# Patient Record
Sex: Male | Born: 1955 | Race: Black or African American | Hispanic: No | Marital: Single | Smoking: Never smoker
Health system: Southern US, Community
[De-identification: ages and names within clinical notes are randomized; demographics above are authoritative.]

---

## 2006-07-31 ENCOUNTER — Ambulatory Visit: Payer: Self-pay | Admitting: Cardiothoracic Surgery

## 2006-09-08 ENCOUNTER — Ambulatory Visit: Payer: Self-pay | Admitting: Cardiothoracic Surgery

## 2012-10-03 DIAGNOSIS — E7800 Pure hypercholesterolemia, unspecified: Secondary | ICD-10-CM | POA: Insufficient documentation

## 2012-10-03 DIAGNOSIS — Z7901 Long term (current) use of anticoagulants: Secondary | ICD-10-CM | POA: Insufficient documentation

## 2012-10-03 DIAGNOSIS — D126 Benign neoplasm of colon, unspecified: Secondary | ICD-10-CM | POA: Insufficient documentation

## 2015-05-29 DIAGNOSIS — J449 Chronic obstructive pulmonary disease, unspecified: Secondary | ICD-10-CM | POA: Insufficient documentation

## 2015-05-29 DIAGNOSIS — N179 Acute kidney failure, unspecified: Secondary | ICD-10-CM | POA: Insufficient documentation

## 2015-08-18 DIAGNOSIS — N529 Male erectile dysfunction, unspecified: Secondary | ICD-10-CM | POA: Insufficient documentation

## 2015-08-18 DIAGNOSIS — Z952 Presence of prosthetic heart valve: Secondary | ICD-10-CM | POA: Insufficient documentation

## 2015-08-18 DIAGNOSIS — I10 Essential (primary) hypertension: Secondary | ICD-10-CM | POA: Insufficient documentation

## 2015-12-27 DIAGNOSIS — I50810 Right heart failure, unspecified: Secondary | ICD-10-CM | POA: Insufficient documentation

## 2015-12-27 DIAGNOSIS — I2729 Other secondary pulmonary hypertension: Secondary | ICD-10-CM | POA: Insufficient documentation

## 2016-04-04 DIAGNOSIS — F17210 Nicotine dependence, cigarettes, uncomplicated: Secondary | ICD-10-CM | POA: Insufficient documentation

## 2016-04-04 DIAGNOSIS — H9041 Sensorineural hearing loss, unilateral, right ear, with unrestricted hearing on the contralateral side: Secondary | ICD-10-CM | POA: Insufficient documentation

## 2016-04-04 DIAGNOSIS — H6991 Unspecified Eustachian tube disorder, right ear: Secondary | ICD-10-CM | POA: Insufficient documentation

## 2016-04-04 DIAGNOSIS — H6981 Other specified disorders of Eustachian tube, right ear: Secondary | ICD-10-CM | POA: Insufficient documentation

## 2016-06-04 ENCOUNTER — Encounter: Payer: Self-pay | Admitting: Family Medicine

## 2016-06-04 ENCOUNTER — Ambulatory Visit (INDEPENDENT_AMBULATORY_CARE_PROVIDER_SITE_OTHER): Payer: Medicaid Other | Admitting: Family Medicine

## 2016-06-04 ENCOUNTER — Ambulatory Visit (HOSPITAL_BASED_OUTPATIENT_CLINIC_OR_DEPARTMENT_OTHER)
Admission: RE | Admit: 2016-06-04 | Discharge: 2016-06-04 | Disposition: A | Discharge status: Home / Self Care | Payer: Medicaid Other | Source: Ambulatory Visit | Attending: Family Medicine | Admitting: Family Medicine

## 2016-06-04 VITALS — BP 110/76 | HR 104 | Ht 74.0 in | Wt 212.0 lb

## 2016-06-04 DIAGNOSIS — M19021 Primary osteoarthritis, right elbow: Secondary | ICD-10-CM | POA: Diagnosis not present

## 2016-06-04 DIAGNOSIS — M25521 Pain in right elbow: Secondary | ICD-10-CM

## 2016-06-04 DIAGNOSIS — M25562 Pain in left knee: Secondary | ICD-10-CM

## 2016-06-04 DIAGNOSIS — M25522 Pain in left elbow: Secondary | ICD-10-CM | POA: Insufficient documentation

## 2016-06-04 DIAGNOSIS — M25421 Effusion, right elbow: Secondary | ICD-10-CM | POA: Insufficient documentation

## 2016-06-04 DIAGNOSIS — G8929 Other chronic pain: Secondary | ICD-10-CM

## 2016-06-04 DIAGNOSIS — M25561 Pain in right knee: Secondary | ICD-10-CM

## 2016-06-04 DIAGNOSIS — M19022 Primary osteoarthritis, left elbow: Secondary | ICD-10-CM | POA: Diagnosis not present

## 2016-06-04 MED ORDER — METHYLPREDNISOLONE ACETATE 40 MG/ML IJ SUSP
40.0000 mg | Freq: Once | INTRAMUSCULAR | Status: AC
  Administered 2016-06-04: 40 mg via INTRA_ARTICULAR

## 2016-06-04 NOTE — Patient Instructions (Addendum)
You have arthritis in your elbows and knees. These are the different medications you can take for this: Tylenol 500mg  1-2 tabs three times a day for pain. Minimize use of aleve, ibuprofen due to risks to heart, kidneys.  Some supplements that may help for arthritis: Boswellia extract, curcumin, pycnogenol. Capsaicin, aspercreme, or biofreeze topically up to four times a day may also help with pain. Cortisone injections are an option - you were given these for the elbows today.. If cortisone injections do not help, there are different types of shots that may help but they take longer to take effect (gel shots for knees). It's important that you continue to stay active. Straight leg raises, knee extensions 3 sets of 10 once a day (add ankle weight if these become too easy). Consider physical therapy to strengthen muscles around the joint that hurts to take pressure off of the joint itself. Shoe inserts with good arch support may be helpful. Heat or ice 15 minutes at a time 3-4 times a day as needed to help with pain. Water aerobics and cycling with low resistance are the best two types of exercise for arthritis. I will touch base with the orthopedists about possibilities for your elbows.

## 2016-06-06 DIAGNOSIS — M25521 Pain in right elbow: Secondary | ICD-10-CM | POA: Insufficient documentation

## 2016-06-06 DIAGNOSIS — M25561 Pain in right knee: Secondary | ICD-10-CM | POA: Insufficient documentation

## 2016-06-06 DIAGNOSIS — M25522 Pain in left elbow: Secondary | ICD-10-CM

## 2016-06-06 DIAGNOSIS — M25562 Pain in left knee: Secondary | ICD-10-CM

## 2016-06-06 NOTE — Assessment & Plan Note (Signed)
with effusions.  2/2 DJD as well.  Tylenol, topical medications, supplements that may help discussed.  He opted to wait on injections and/or aspirations for now.  Home exercises reviewed.

## 2016-06-06 NOTE — Progress Notes (Addendum)
PCP: Rogers Blocker, MD  Subjective:   HPI: Patient is a 61 y.o. male here for bilateral elbow, knee pain.  Patient reports biggest concern is pain in both elbows. Has had over 4 year history of pain in both diffusely anterior to posterior. Pain level 0/10 at rest but can get up to 10/10 and sharp at times. Associated swelling. Tried wrapping these. Is left handed. Not taking any medicines for this besides tylenol (on warfarin). Also has bilateral knee pain with swelling - left knee worse than right (7/10 vs 5/10 at worst). No recent injuries. No catching or locking. Worse with ambulation. No skin changes, numbness.  No past medical history on file.  No current outpatient prescriptions on file prior to visit.   No current facility-administered medications on file prior to visit.     No past surgical history on file.  No Known Allergies  Social History   Social History  . Marital status: Single    Spouse name: N/A  . Number of children: N/A  . Years of education: N/A   Occupational History  . Not on file.   Social History Main Topics  . Smoking status: Never Smoker  . Smokeless tobacco: Never Used  . Alcohol use Not on file  . Drug use: Unknown  . Sexual activity: Not on file   Other Topics Concern  . Not on file   Social History Narrative  . No narrative on file    No family history on file.  BP 110/76   Pulse (!) 104   Ht 6\' 2"  (1.88 m)   Wt 212 lb (96.2 kg)   BMI 27.22 kg/m   Review of Systems: See HPI above.     Objective:  Physical Exam:  Gen: NAD, comfortable in exam room  Bilateral elbows: Mild swelling diffusely.  No bruising, other deformity. No focal tenderness throughout elbows. Lacks 5 degrees flexion and 37 degrees extension on both elbows. Collateral ligaments intact. Strength 5/5 with flexion and extension. Sensation intact to light touch. NVI distally.  Bilateral knees: Mod effusion.  No other gross deformity,  ecchymoses. No TTP currently. ROM 0 - 120 degrees. Negative ant/post drawers. Negative valgus/varus testing. Negative lachmanns. Negative mcmurrays, apleys, patellar apprehension. NV intact distally.   Assessment & Plan:  1. Bilateral elbow pain - independently reviewed radiographs - patient with severe arthropathy of both elbows.  We discussed tylenol, topical medications, supplements that may help.  Cortisone injections given today.  Will review radiographs with ortho as well.  After informed written consent, patient was seated on exam table. Right elbow was prepped with alcohol swab and utilizing ultrasound guidance to identify joint right elbow was injected with 2:1 bupivicaine: depomedrol. Patient tolerated the procedure well without immediate complications.  After informed written consent, patient was seated on exam table. Left elbow was prepped with alcohol swab and utilizing ultrasound guidance to identify joint left elbow was injected with 2:1 bupivicaine: depomedrol. Patient tolerated the procedure well without immediate complications.  2. Bilateral knee pain - with effusions.  2/2 DJD as well.  Tylenol, topical medications, supplements that may help discussed.  He opted to wait on injections and/or aspirations for now.  Home exercises reviewed.   Addendum:  Discussed severe elbow arthropathy with ortho - discussed arthroscopy and OK procedures if patient wanted to consider operative intervention which would help with elbow motion.  Attempted to contact patient by phone but number is disconnected - letter sent.

## 2016-06-06 NOTE — Assessment & Plan Note (Signed)
independently reviewed radiographs - patient with severe arthropathy of both elbows.  We discussed tylenol, topical medications, supplements that may help.  Cortisone injections given today.  Will review radiographs with ortho as well.  After informed written consent, patient was seated on exam table. Right elbow was prepped with alcohol swab and utilizing ultrasound guidance to identify joint right elbow was injected with 2:1 bupivicaine: depomedrol. Patient tolerated the procedure well without immediate complications.  After informed written consent, patient was seated on exam table. Left elbow was prepped with alcohol swab and utilizing ultrasound guidance to identify joint left elbow was injected with 2:1 bupivicaine: depomedrol. Patient tolerated the procedure well without immediate complications.

## 2016-11-20 ENCOUNTER — Ambulatory Visit: Payer: Self-pay | Admitting: Family Medicine

## 2016-11-21 ENCOUNTER — Ambulatory Visit: Payer: Self-pay | Admitting: Family Medicine

## 2016-12-11 ENCOUNTER — Ambulatory Visit (INDEPENDENT_AMBULATORY_CARE_PROVIDER_SITE_OTHER): Payer: Medicaid Other | Admitting: Family Medicine

## 2016-12-11 ENCOUNTER — Encounter: Payer: Self-pay | Admitting: Family Medicine

## 2016-12-11 VITALS — BP 104/75 | HR 110 | Ht 74.0 in | Wt 230.0 lb

## 2016-12-11 DIAGNOSIS — M79642 Pain in left hand: Secondary | ICD-10-CM | POA: Diagnosis not present

## 2016-12-11 DIAGNOSIS — M79641 Pain in right hand: Secondary | ICD-10-CM | POA: Diagnosis not present

## 2016-12-11 DIAGNOSIS — M79645 Pain in left finger(s): Secondary | ICD-10-CM | POA: Diagnosis not present

## 2016-12-11 DIAGNOSIS — M25531 Pain in right wrist: Secondary | ICD-10-CM

## 2016-12-11 MED ORDER — METHYLPREDNISOLONE ACETATE 40 MG/ML IJ SUSP
40.0000 mg | Freq: Once | INTRAMUSCULAR | Status: AC
  Administered 2016-12-11: 40 mg via INTRA_ARTICULAR

## 2016-12-11 MED ORDER — METHYLPREDNISOLONE ACETATE 40 MG/ML IJ SUSP
20.0000 mg | Freq: Once | INTRAMUSCULAR | Status: AC
  Administered 2016-12-11: 20 mg via INTRA_ARTICULAR

## 2016-12-11 NOTE — Patient Instructions (Signed)
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 I'd recommend evaluation with rheumatology - will send a note to your family doctor. These are the different medications you can take for this: Tylenol 500mg  1-2 tabs three times a day for pain. Capsaicin, aspercreme, or biofreeze topically up to four times a day may also help with pain. Some supplements that may help for arthritis: Boswellia extract, curcumin, pycnogenol Minimize using aleve or other anti-inflammatories. Cortisone injections are an option - you were given these today. Consider wrist brace to rest right wrist. Ice 15 minutes at a time 3-4 times a day as needed to help with pain. Follow up with me in 1 month.

## 2016-12-12 DIAGNOSIS — I10 Essential (primary) hypertension: Secondary | ICD-10-CM | POA: Insufficient documentation

## 2016-12-12 DIAGNOSIS — M79645 Pain in left finger(s): Secondary | ICD-10-CM | POA: Insufficient documentation

## 2016-12-12 DIAGNOSIS — M25531 Pain in right wrist: Secondary | ICD-10-CM | POA: Insufficient documentation

## 2016-12-12 DIAGNOSIS — I4821 Permanent atrial fibrillation: Secondary | ICD-10-CM | POA: Insufficient documentation

## 2016-12-12 NOTE — Assessment & Plan Note (Signed)
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  Tylenol, topical medications, supplements reviewed.    After informed written consent patient was seated in wheelchair.  Left 2nd MCP identified with ultrasound, prepped with alcohol swab then injected with 0.5:0.16mL bupivicaine:depomedrol.  Patient tolerated procedure well without immediate complications.

## 2016-12-12 NOTE — Assessment & Plan Note (Signed)
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  He does have history of gouty arthritis as well.  Discussed tylenol, topical medications, supplements that may help.  Consider wrist brace.  Icing.  Injection given today.  After informed written consent patient was seated in wheelchair.  Right radiocarpal joint identified with ultrasound, prepped with alcohol swab then injected with 1:59mL bupivicaine:depomedrol.  Patient tolerated procedure well without immediate complications.

## 2016-12-12 NOTE — Progress Notes (Signed)
PCP: Rogers Blocker, MD  Subjective:   HPI: Patient is a 61 y.o. male here for right wrist, left hand pain.  Patient reports he's had increased problems with right dorsal wrist and left hand about index finger at the base for past 2 months. Usually has swelling in these areas but worse recently. Pain up to 10/10 and sharp. Tried wrapping these only, no medications for pain. He is left handed. No skin changes, numbness.  No past medical history on file.  Current Outpatient Prescriptions on File Prior to Visit  Medication Sig Dispense Refill  . bumetanide (BUMEX) 1 MG tablet TK 1 T PO BID  2  . Colchicine 0.6 MG CAPS TK ONE C PO QD  1  . loratadine (CLARITIN) 10 MG tablet TK 1 T PO ONCE D  2  . metoprolol tartrate (LOPRESSOR) 25 MG tablet TK 1/2 T PO BID  1  . PROVENTIL HFA 108 (90 Base) MCG/ACT inhaler INL 1 TO 2 PFS PO Q 4 TO 6 H PRF SOB OR WHZ  5  . spironolactone (ALDACTONE) 50 MG tablet TK 1 T PO BID  2  . warfarin (COUMADIN) 7.5 MG tablet TK DAILY AS DIRECTED  1   No current facility-administered medications on file prior to visit.     No past surgical history on file.  No Known Allergies  Social History   Social History  . Marital status: Single    Spouse name: N/A  . Number of children: N/A  . Years of education: N/A   Occupational History  . Not on file.   Social History Main Topics  . Smoking status: Never Smoker  . Smokeless tobacco: Never Used  . Alcohol use Not on file  . Drug use: Unknown  . Sexual activity: Not on file   Other Topics Concern  . Not on file   Social History Narrative  . No narrative on file    No family history on file.  BP 104/75   Pulse (!) 110   Ht 6\' 2"  (1.88 m)   Wt 230 lb (104.3 kg)   BMI 29.53 kg/m   Review of Systems: See HPI above.     Objective:  Physical Exam:  Gen: NAD, comfortable in exam room  Right wrist/hand: Diffuse dorsal swelling, 1+ pitting edema in dorsal hand.  No malrotation of digits but  swelling of 2nd, 3rd MCPs noted. TTP dorsal wrist joint but no tenderness over snuffbox, digits. Mild limitation flexion and extension of all digits but no pain.  Mod limitation extension wrist, mild limitation flexion. Negative tinels, finkelsteins. NVI distally.  Left wrist/hand: Significant swelling 2nd MCP circumferentially.  Very No malrotation or angulation.  No erythema. TTP 2nd MCP.  No other tenderness of hand or wrist. Significant limitation motion at 2nd MCP. NVI distally.  Assessment & Plan:  1. 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  He does have history of gouty arthritis as well.  Discussed tylenol, topical medications, supplements that may help.  Consider wrist brace.  Icing.  Injection given today.  After informed written consent patient was seated in wheelchair.  Right radiocarpal joint identified with ultrasound, prepped with alcohol swab then injected with 1:35mL bupivicaine:depomedrol.  Patient tolerated procedure well without immediate complications.  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  Tylenol, topical medications,  supplements reviewed.    After informed written consent patient was seated in wheelchair.  Left 2nd MCP identified with ultrasound, prepped with alcohol swab then injected with 0.5:0.73mL bupivicaine:depomedrol.  Patient tolerated procedure well without immediate complications.

## 2017-06-27 DEATH — deceased

## 2018-05-03 IMAGING — CR DG ELBOW 2V*L*
2 series · 2 of 2 positions shown · non-contrast
Comparison: None.

CLINICAL DATA: Left elbow pain.

EXAM:
LEFT ELBOW - 2 VIEW

[x elbow joint ap left]
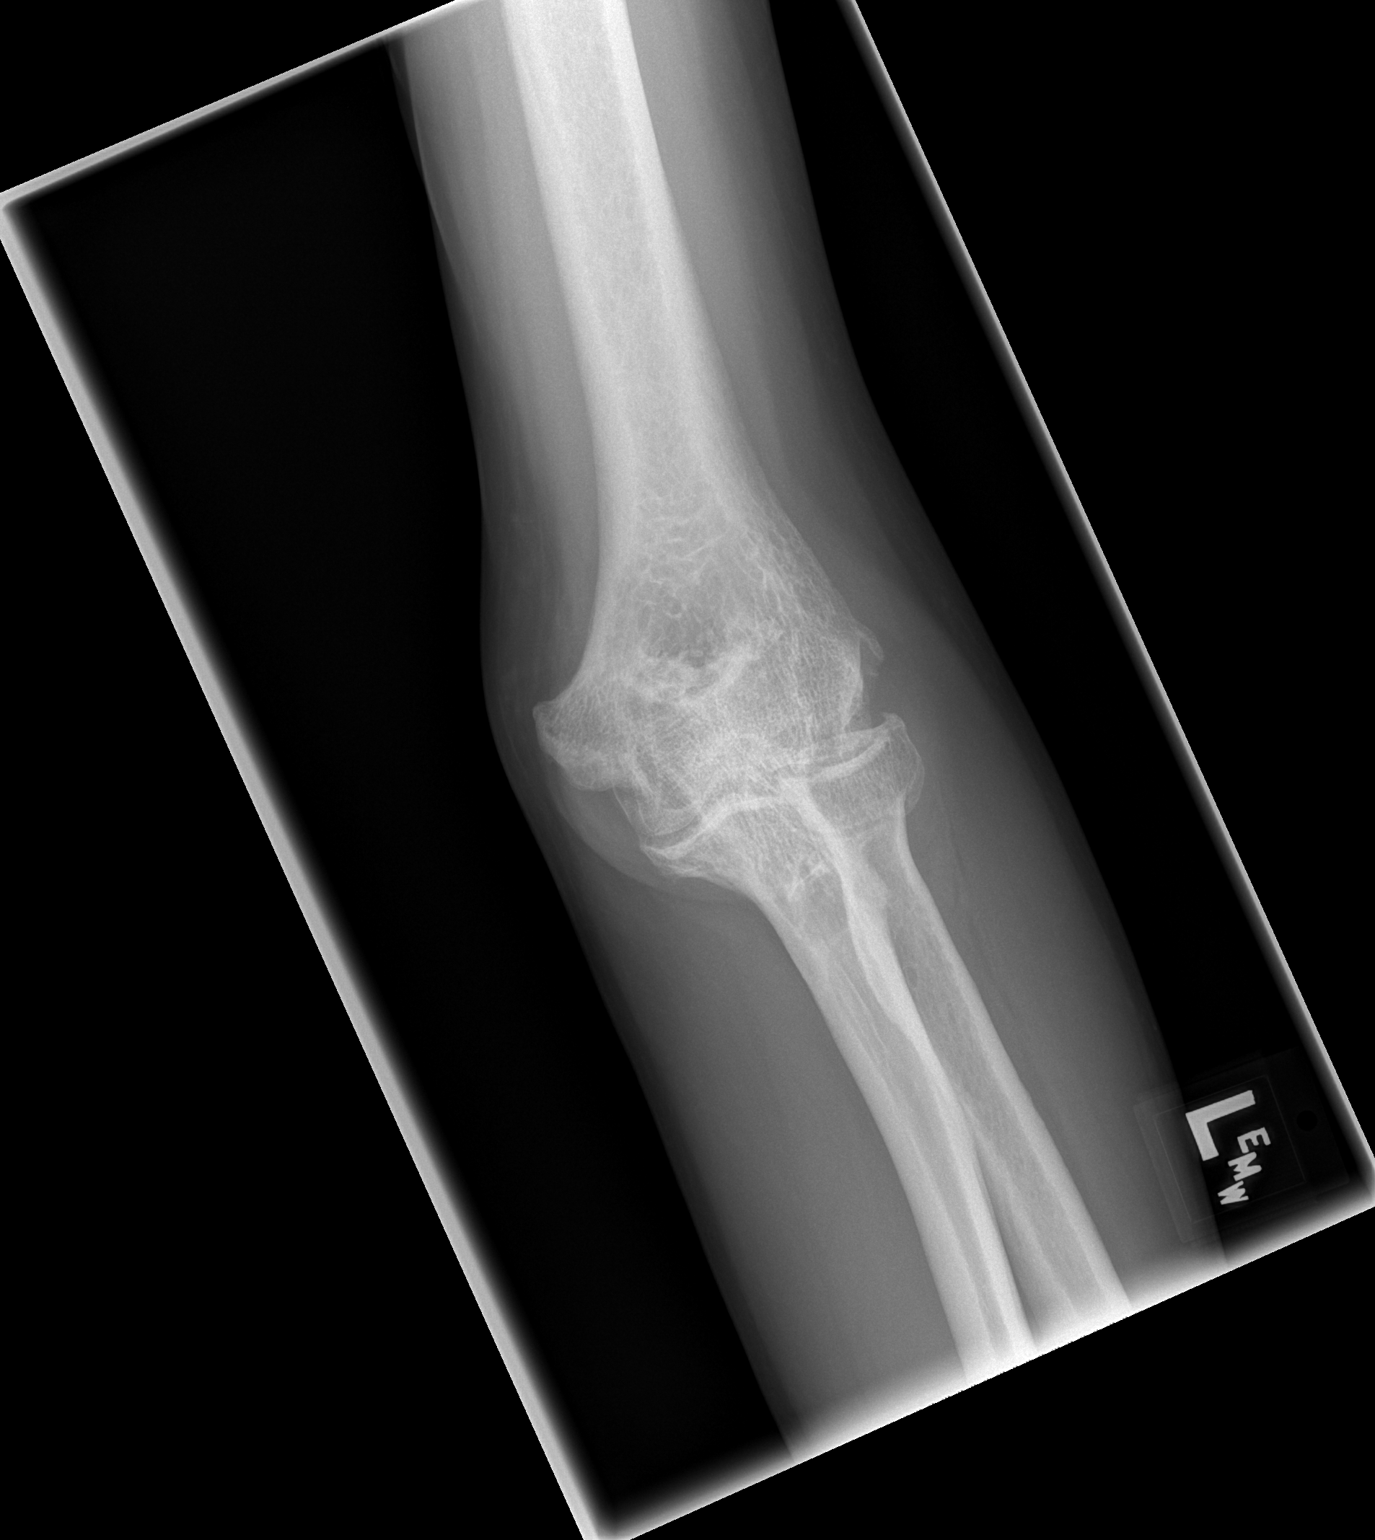

[x elbow joint lat left]
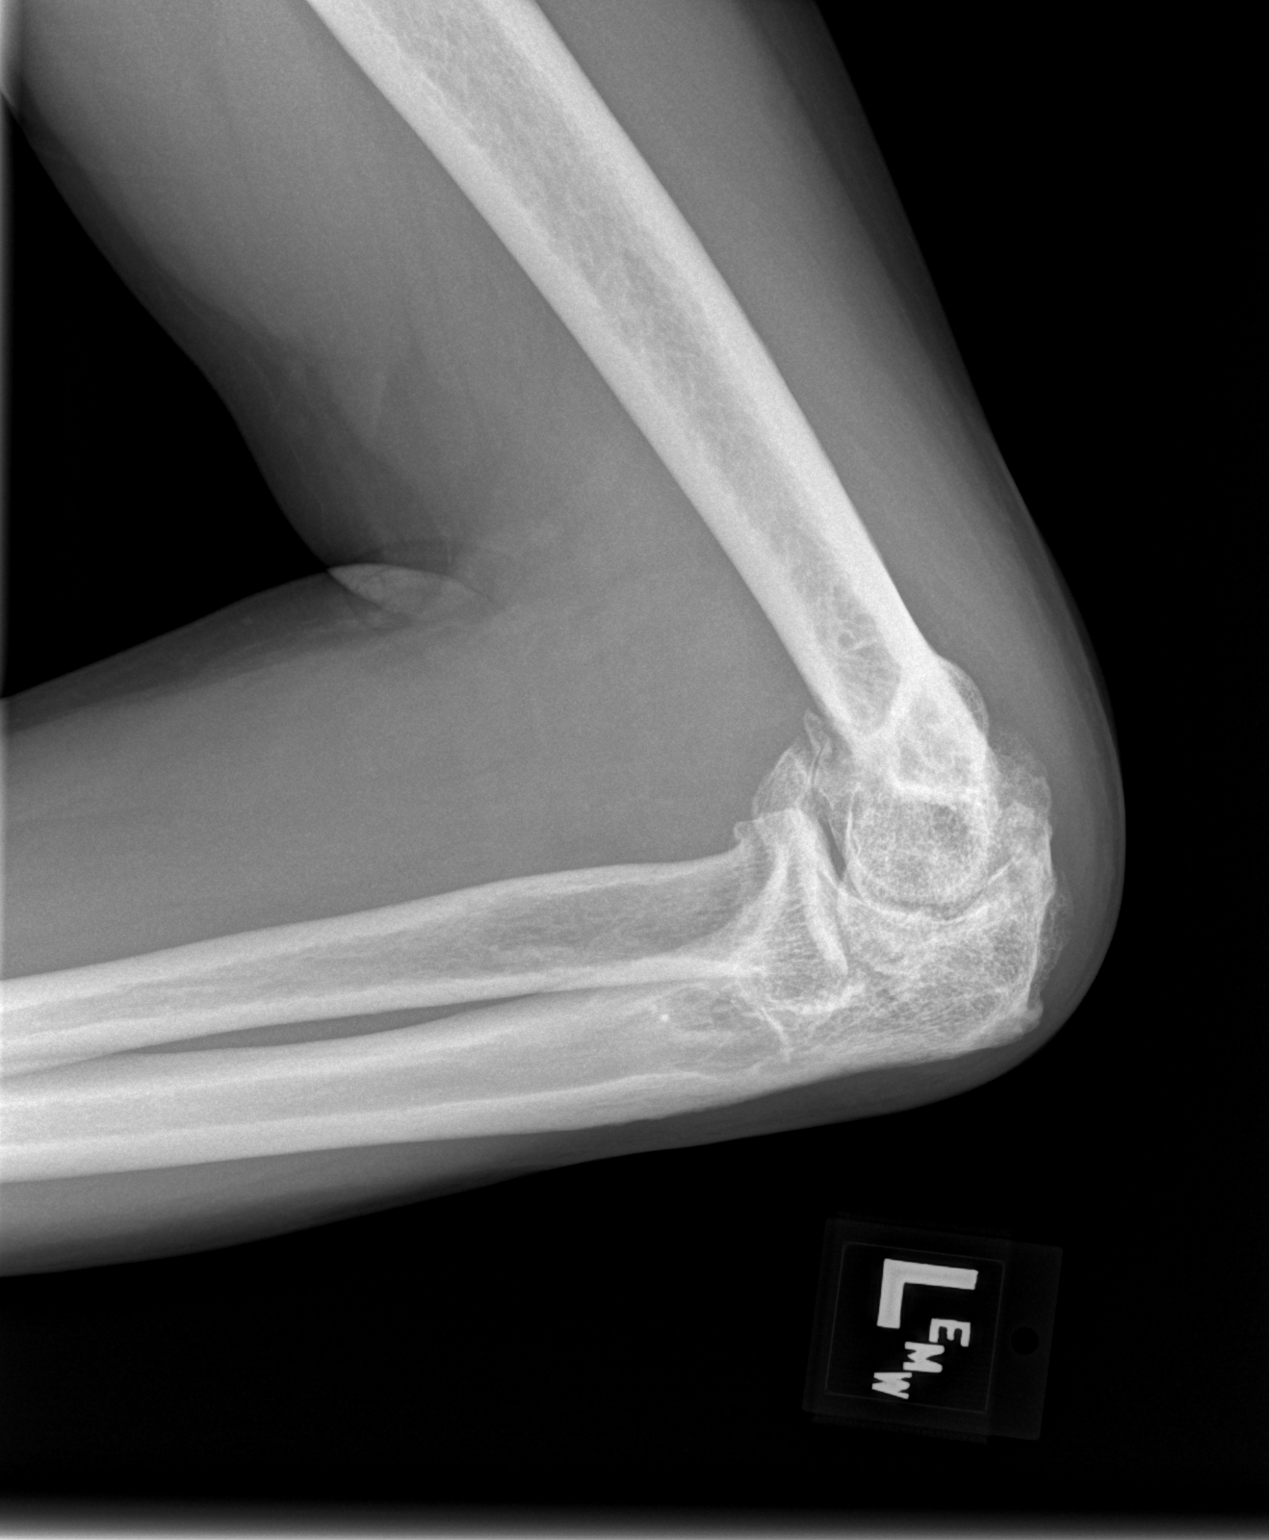

[2 of 2 positions shown; findings below may reference images not displayed]

FINDINGS: Advanced degenerative changes with joint space narrowing and large,
nearly bridging osteophytes. No obvious erosions or joint effusion.
No acute abnormality.
IMPRESSION: Significant elbow joint degenerative changes.

## 2018-05-03 IMAGING — CR DG KNEE AP/LAT W/ SUNRISE*R*
3 series · 3 of 3 positions shown · non-contrast
Comparison: None.

CLINICAL DATA: Right knee pain.

EXAM:
RIGHT KNEE 3 VIEWS

[w knee ap right *]
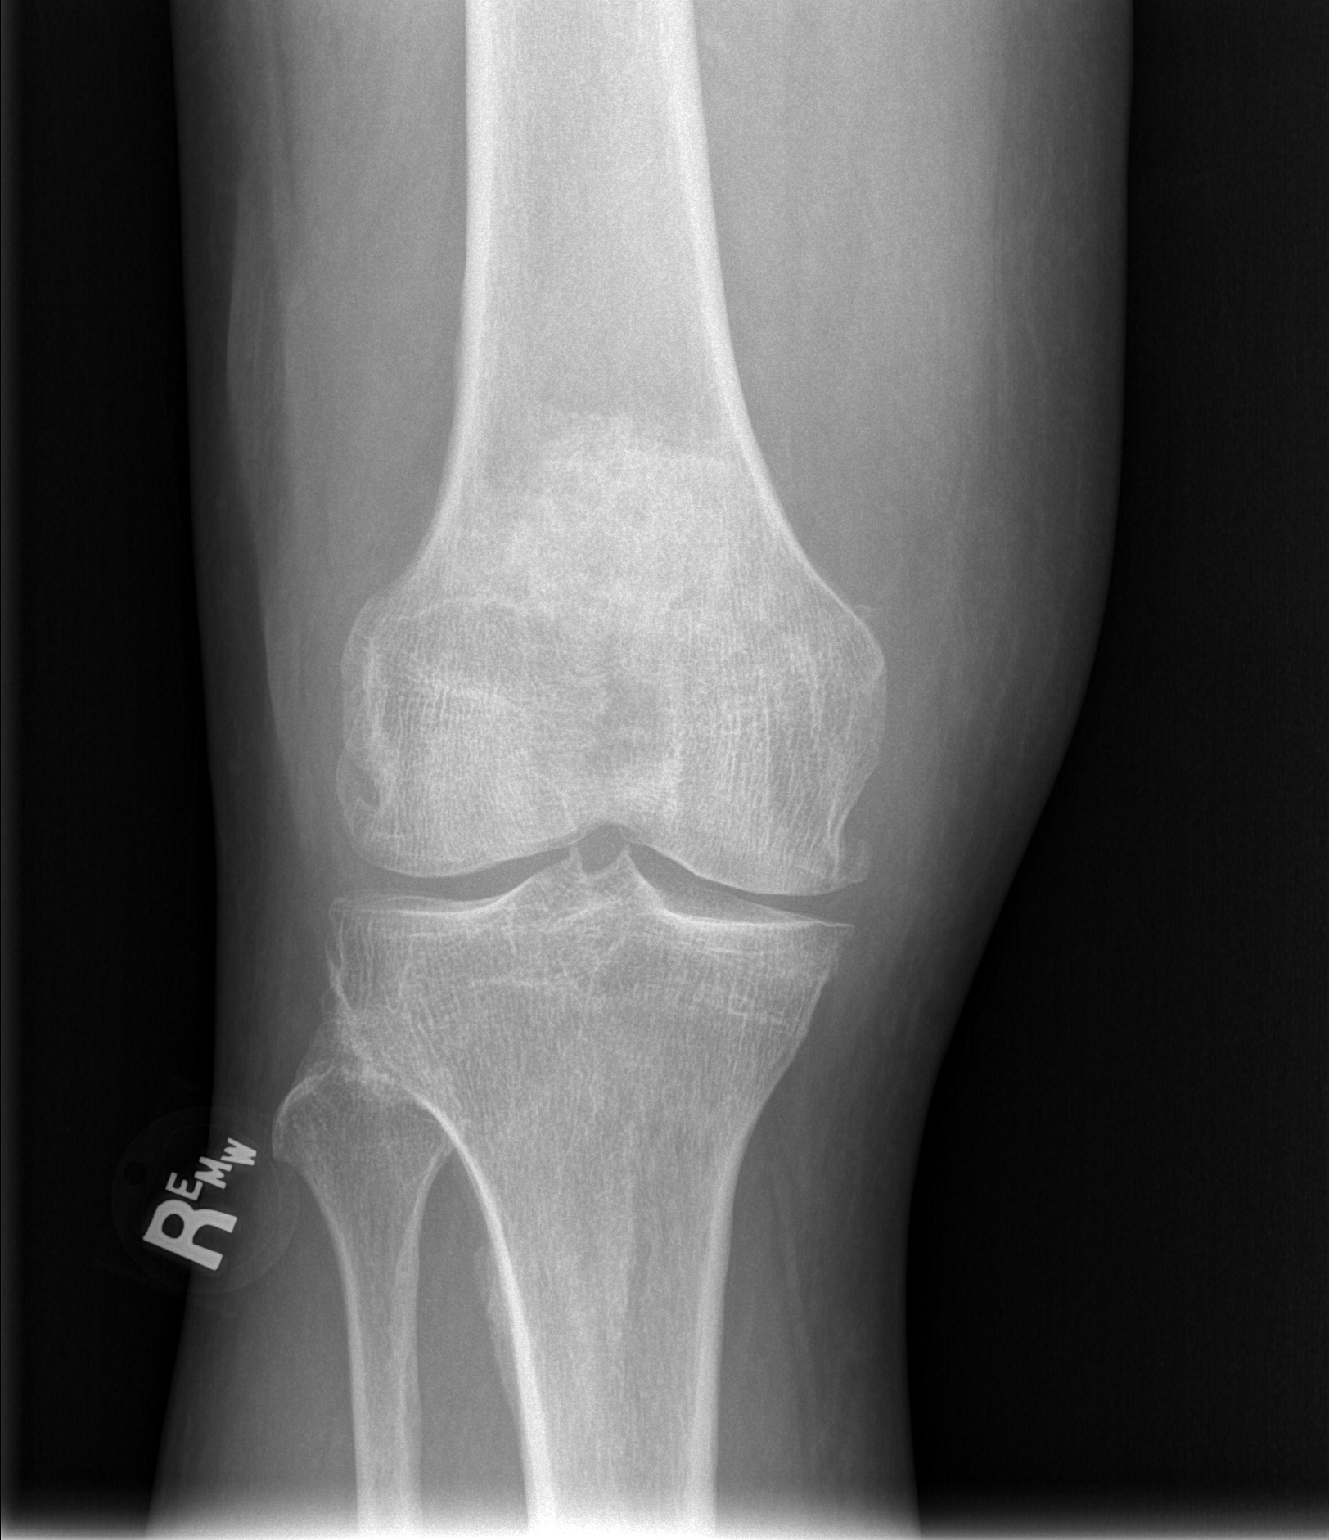

[w knee lat. right *]
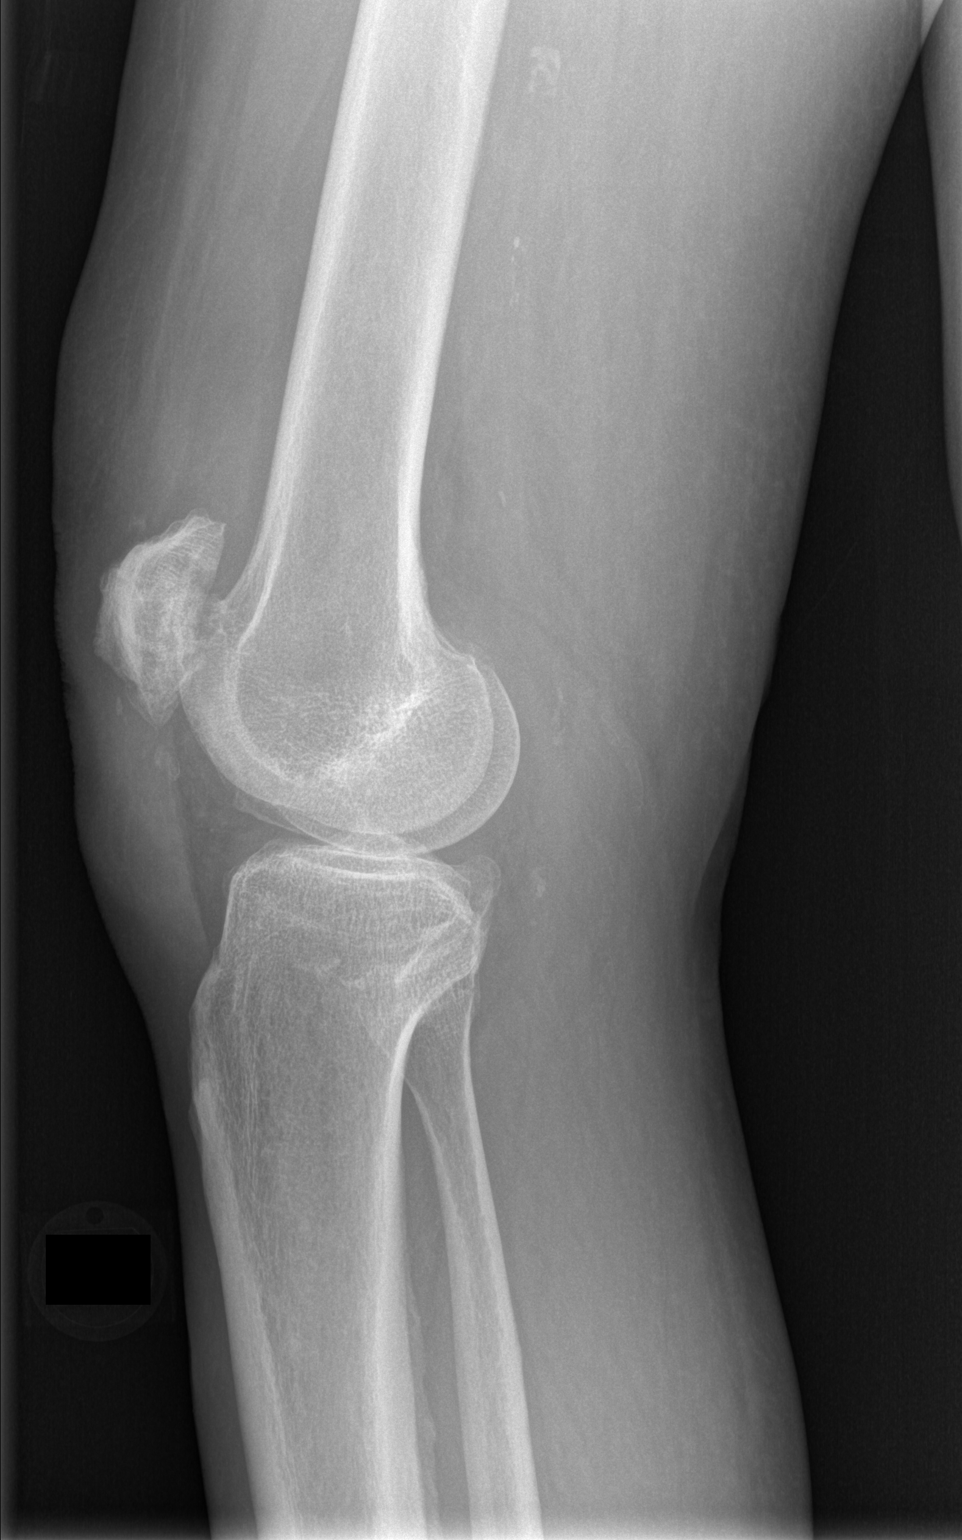

[t patella  right]
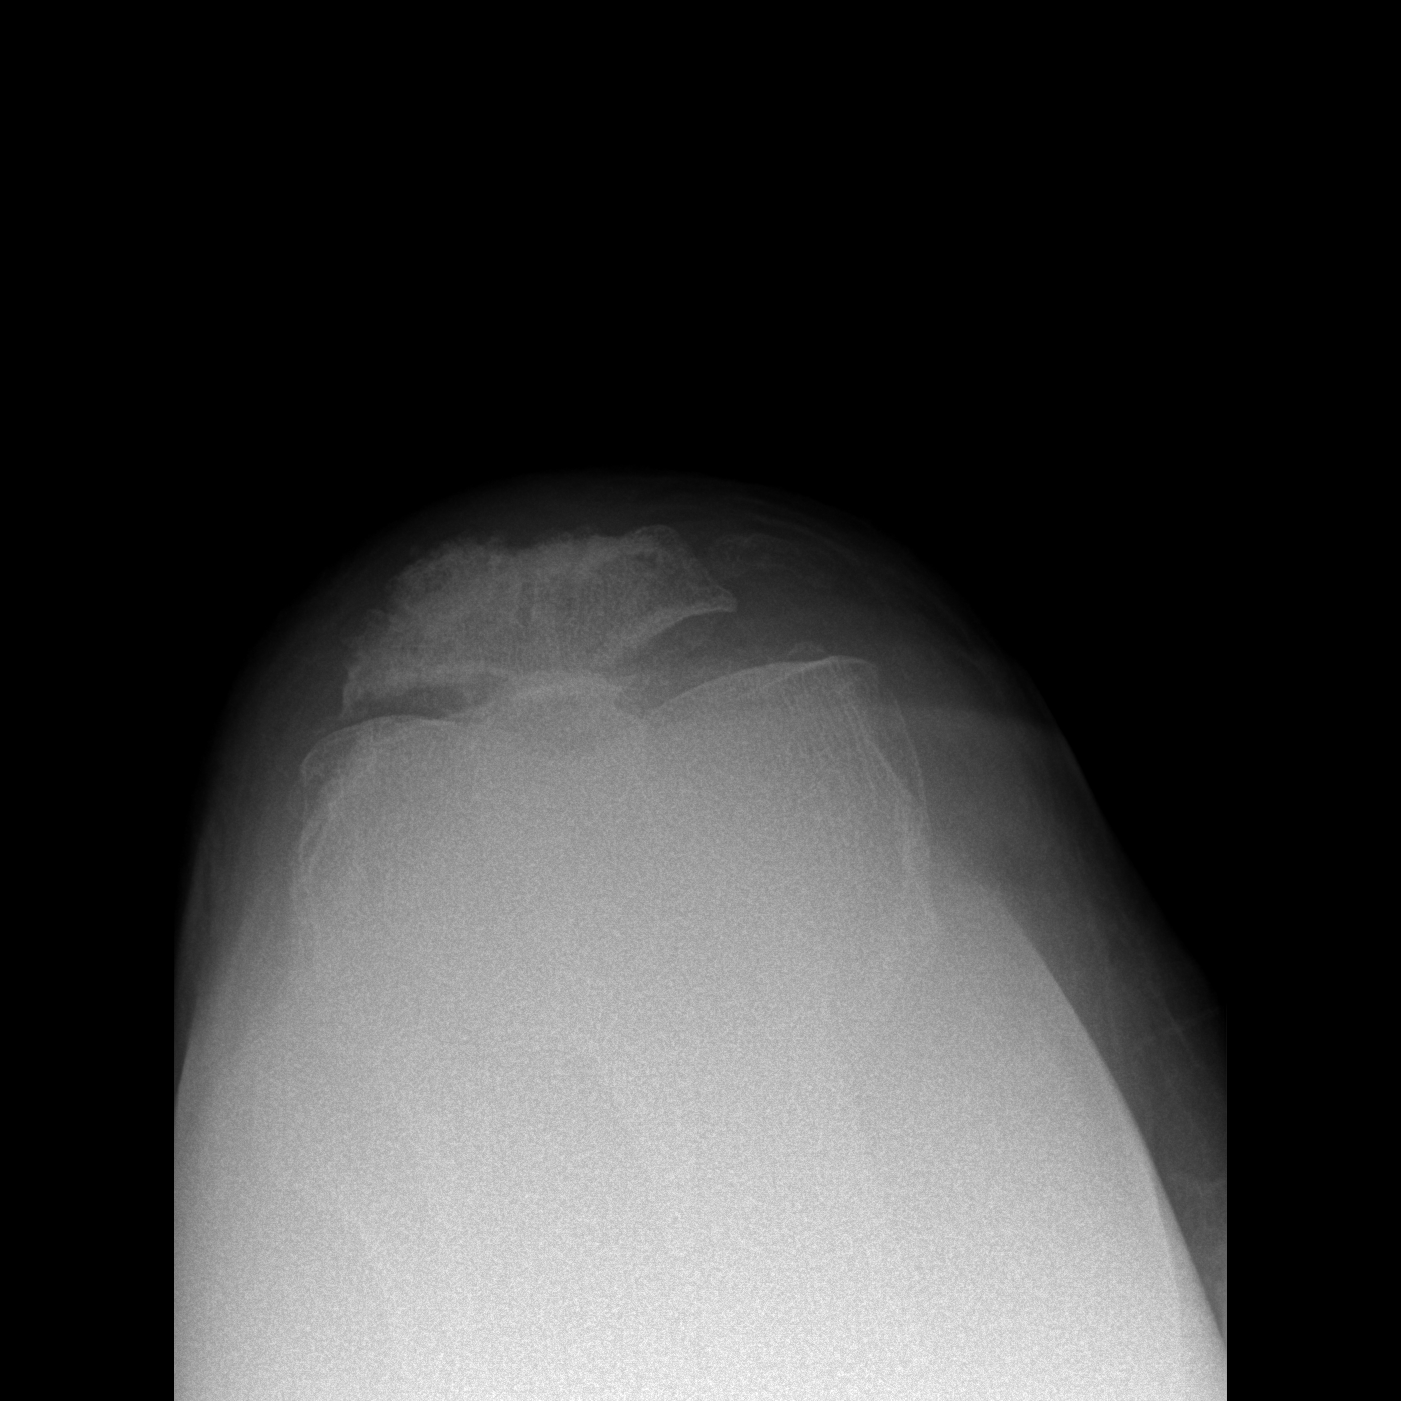

[3 of 3 positions shown; findings below may reference images not displayed]

FINDINGS: Moderate tricompartmental degenerative changes with joint space
narrowing and spurring. No fracture or osteochondral lesion. Soft
tissue thickening along the anterior aspect of the knee could be
inflammation or prepatellar bursitis. Recommend clinical
correlation.
IMPRESSION: Moderate degenerative changes and probable small joint effusion.
